# Patient Record
Sex: Female | Born: 1970 | Race: White | Hispanic: No | Marital: Married | Smoking: Never smoker
Health system: Southern US, Community
[De-identification: ages and names within clinical notes are randomized; demographics above are authoritative.]

---

## 2004-04-28 ENCOUNTER — Other Ambulatory Visit: Admission: RE | Admit: 2004-04-28 | Discharge: 2004-04-28 | Payer: Self-pay | Admitting: Obstetrics & Gynecology

## 2005-05-12 ENCOUNTER — Other Ambulatory Visit: Admission: RE | Admit: 2005-05-12 | Discharge: 2005-05-12 | Payer: Self-pay | Admitting: Obstetrics & Gynecology

## 2005-05-14 ENCOUNTER — Ambulatory Visit (HOSPITAL_COMMUNITY): Admission: RE | Admit: 2005-05-14 | Discharge: 2005-05-14 | Payer: Self-pay | Admitting: Obstetrics & Gynecology

## 2005-07-19 ENCOUNTER — Emergency Department: Payer: Self-pay | Admitting: Emergency Medicine

## 2006-11-15 ENCOUNTER — Ambulatory Visit: Payer: Self-pay

## 2006-11-30 ENCOUNTER — Ambulatory Visit: Payer: Self-pay

## 2006-12-28 ENCOUNTER — Ambulatory Visit: Payer: Self-pay

## 2007-01-12 ENCOUNTER — Inpatient Hospital Stay: Payer: Self-pay

## 2007-02-17 IMAGING — CR PELVIS - 1-2 VIEW
1 series · 1 of 1 positions shown · non-contrast
Comparison: none

REASON FOR EXAM: injury
COMMENTS:

PROCEDURE:     DXR - DXR PELVIS AP ONLY  - July 19, 2005  [DATE]
RESULT:       An AP view of the bony pelvis shows no fracture or other acute
bony abnormality. The hips are visualized bilaterally in the AP view and are
normal in appearance.

[view not recorded]
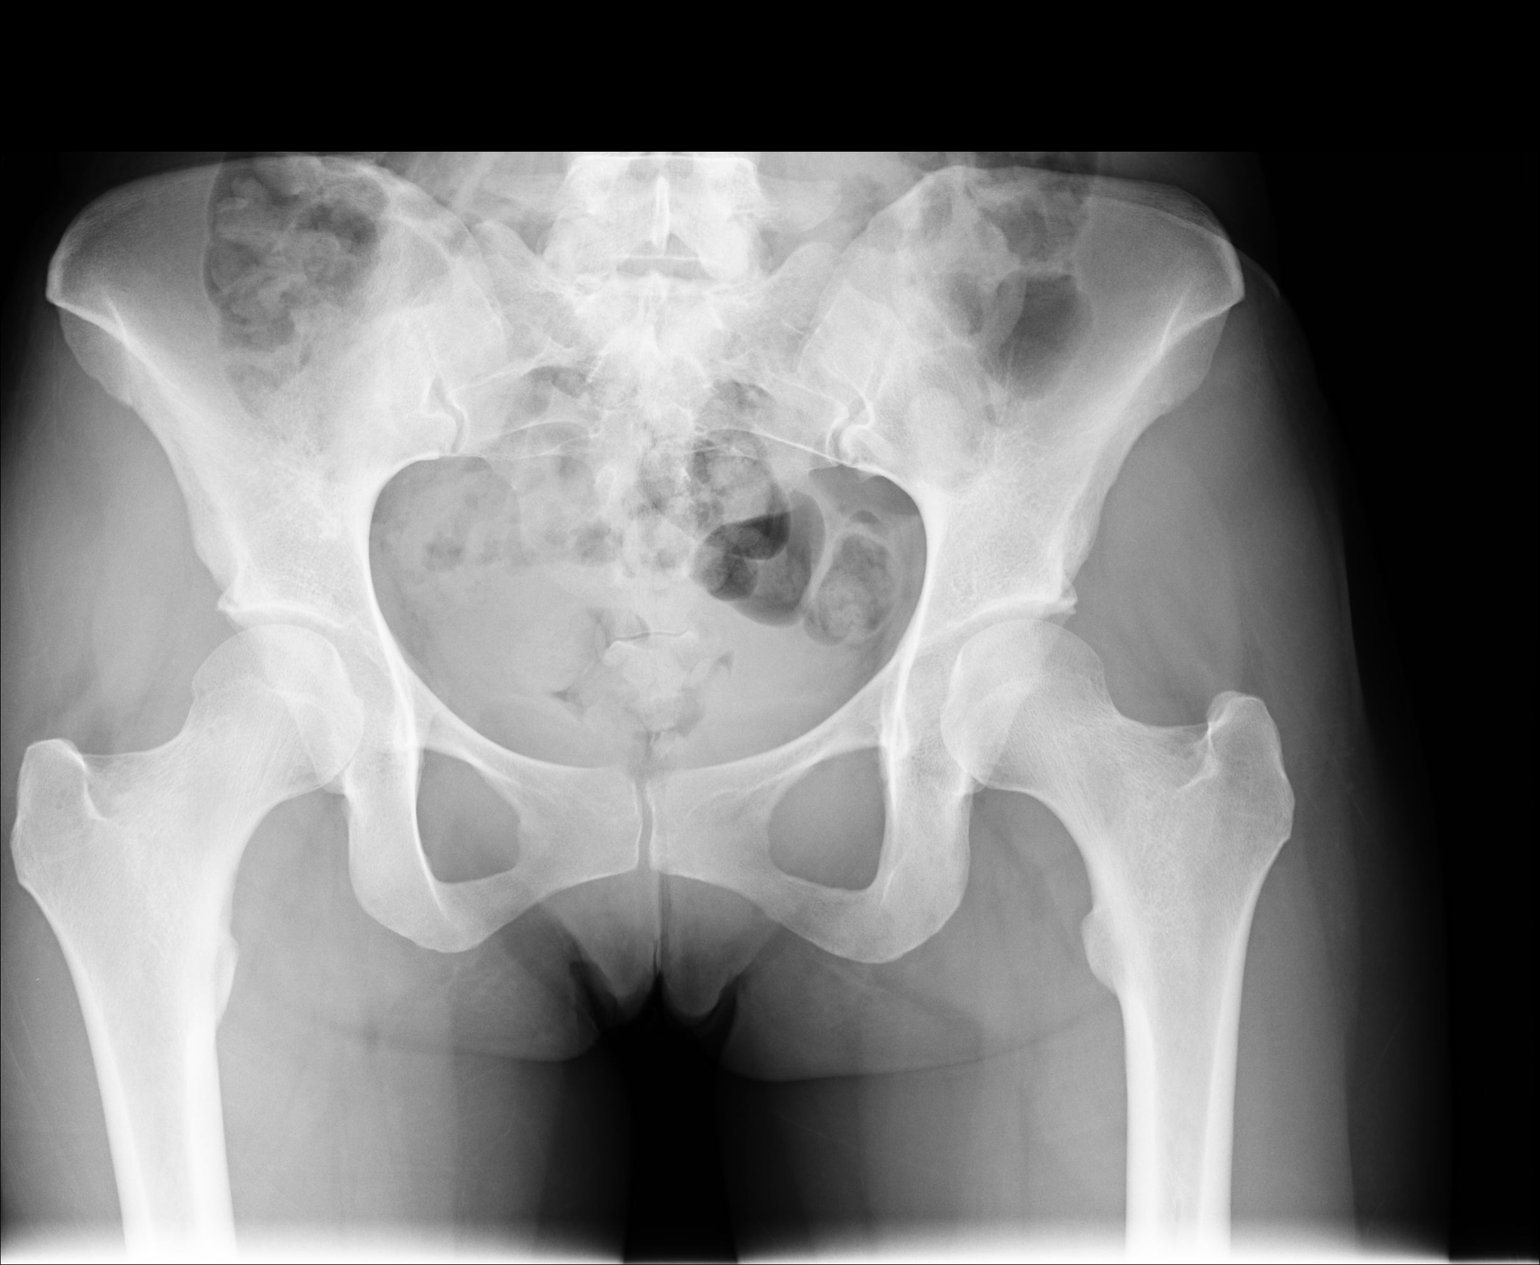

[1 of 1 positions shown; findings below may reference images not displayed]

IMPRESSION: No significant osseous abnormalities are noted.

## 2009-12-31 ENCOUNTER — Ambulatory Visit: Payer: Self-pay | Admitting: Obstetrics and Gynecology

## 2017-01-24 ENCOUNTER — Emergency Department: Payer: 59

## 2017-01-24 ENCOUNTER — Emergency Department
Admission: EM | Admit: 2017-01-24 | Discharge: 2017-01-24 | Disposition: A | Discharge status: Home / Self Care | Payer: 59 | Attending: Emergency Medicine | Admitting: Emergency Medicine

## 2017-01-24 DIAGNOSIS — Y929 Unspecified place or not applicable: Secondary | ICD-10-CM | POA: Diagnosis not present

## 2017-01-24 DIAGNOSIS — Y939 Activity, unspecified: Secondary | ICD-10-CM | POA: Insufficient documentation

## 2017-01-24 DIAGNOSIS — Y999 Unspecified external cause status: Secondary | ICD-10-CM | POA: Diagnosis not present

## 2017-01-24 DIAGNOSIS — S39012A Strain of muscle, fascia and tendon of lower back, initial encounter: Secondary | ICD-10-CM | POA: Insufficient documentation

## 2017-01-24 DIAGNOSIS — X58XXXA Exposure to other specified factors, initial encounter: Secondary | ICD-10-CM | POA: Diagnosis not present

## 2017-01-24 DIAGNOSIS — S3992XA Unspecified injury of lower back, initial encounter: Secondary | ICD-10-CM | POA: Diagnosis present

## 2017-01-24 LAB — POCT PREGNANCY, URINE: Preg Test, Ur: NEGATIVE

## 2017-01-24 MED ORDER — TRAMADOL HCL 50 MG PO TABS: 50.0000 mg | ORAL_TABLET | Freq: Four times a day (QID) | ORAL | 0 refills | Status: DC | PRN

## 2017-01-24 MED ORDER — CYCLOBENZAPRINE HCL 10 MG PO TABS: 10.0000 mg | ORAL_TABLET | Freq: Three times a day (TID) | ORAL | 0 refills | Status: DC | PRN

## 2017-01-24 MED ORDER — ONDANSETRON 8 MG PO TBDP
8.0000 mg | ORAL_TABLET | Freq: Once | ORAL | Status: AC
  Administered 2017-01-24: 8 mg via ORAL

## 2017-01-24 MED ORDER — ONDANSETRON 4 MG PO TBDP
ORAL_TABLET | ORAL | Status: AC
  Administered 2017-01-24: 8 mg via ORAL
  Filled 2017-01-24: qty 1

## 2017-01-24 MED ORDER — KETOROLAC TROMETHAMINE 60 MG/2ML IM SOLN
30.0000 mg | Freq: Once | INTRAMUSCULAR | Status: AC
  Administered 2017-01-24: 30 mg via INTRAMUSCULAR
  Filled 2017-01-24: qty 2

## 2017-01-24 MED ORDER — HYDROMORPHONE HCL 1 MG/ML IJ SOLN
1.0000 mg | Freq: Once | INTRAMUSCULAR | Status: AC
  Administered 2017-01-24: 1 mg via INTRAMUSCULAR
  Filled 2017-01-24: qty 1

## 2017-01-24 MED ORDER — ORPHENADRINE CITRATE 30 MG/ML IJ SOLN
60.0000 mg | Freq: Two times a day (BID) | INTRAMUSCULAR | Status: DC
  Administered 2017-01-24: 60 mg via INTRAMUSCULAR
  Filled 2017-01-24: qty 2

## 2017-01-24 MED ORDER — ONDANSETRON 4 MG PO TBDP: 4.0000 mg | ORAL_TABLET | Freq: Once | ORAL | Status: DC

## 2017-01-24 NOTE — ED Triage Notes (Signed)
Pt reports she is having severe back pain for the last 3 weeks - she went to urgent care last week and was given meloxicam and tylenol but neither are helper the pain - pt denies any urinary frequency/urgency/pain - reports back pain worse with all movement

## 2017-01-24 NOTE — ED Provider Notes (Signed)
[facility] Emergency Department Provider Note   ____________________________________________   First MD Initiated Contact with Patient 01/24/17 1926     (approximate)  I have reviewed the triage vital signs and the nursing notes.   HISTORY  Chief Complaint Back Pain    HPI Natalie Floyd is a 46 y.o. female patient complaining of 3 weeks of severe back pain. Patient cannot recall any provocative measures for her complaint. Patient states she went to urgent care clinic last week and was given meloxicam and Tylenol. She stated neither one is helping. She stated the examiner told her back might be out of line. Patient denies any radicular component to her pain. Patient denies any bladder or bowel dysfunction. Patient rates the pain as a 5/10.Patient described a pain as "burning/achy". No other palliative measures for her complaint.   History reviewed. No pertinent past medical history.  There are no active problems to display for this patient.   History reviewed. No pertinent surgical history.  Prior to Admission medications   Medication Sig Start Date End Date Taking? Authorizing Provider  cyclobenzaprine (FLEXERIL) 10 MG tablet Take 1 tablet (10 mg total) by mouth 3 (three) times daily as needed. 01/24/17   Joni Reining, PA-C  traMADol (ULTRAM) 50 MG tablet Take 1 tablet (50 mg total) by mouth every 6 (six) hours as needed. 01/24/17 01/24/18  Joni Reining, PA-C    Allergies Patient has no known allergies.  No family history on file.  Social History Social History  Substance Use Topics  . Smoking status: Never Smoker  . Smokeless tobacco: Never Used  . Alcohol use No    Review of Systems Constitutional: No fever/chills Eyes: No visual changes. ENT: No sore throat. Cardiovascular: Denies chest pain. Respiratory: Denies shortness of breath. Gastrointestinal: No abdominal pain.  No nausea, no vomiting.  No diarrhea.  No  constipation. Genitourinary: Negative for dysuria. Musculoskeletal:Positive for back pain. Skin: Negative for rash. Neurological: Negative for headaches, focal weakness or numbness.    ____________________________________________   PHYSICAL EXAM:  VITAL SIGNS: ED Triage Vitals  Enc Vitals Group     BP 01/24/17 1831 135/66     Pulse Rate 01/24/17 1831 63     Resp 01/24/17 1831 15     Temp 01/24/17 1831 98.4 F (36.9 C)     Temp Source 01/24/17 1831 Oral     SpO2 01/24/17 1831 100 %     Weight 01/24/17 1829 140 lb (63.5 kg)     Height 01/24/17 1829 5\' 5"  (1.651 m)     Head Circumference --      Peak Flow --      Pain Score 01/24/17 1830 5     Pain Loc --      Pain Edu? --      Excl. in GC? --     Constitutional: Alert and oriented. Well appearing and in no acute distress. Eyes: Conjunctivae are normal. PERRL. EOMI. Head: Atraumatic. Nose: No congestion/rhinnorhea. Mouth/Throat: Mucous membranes are moist.  Oropharynx non-erythematous. Neck: No stridor.  No cervical spine tenderness to palpation. Hematological/Lymphatic/Immunilogical: No cervical lymphadenopathy. Cardiovascular: Normal rate, regular rhythm. Grossly normal heart sounds.  Good peripheral circulation. Respiratory: Normal respiratory effort.  No retractions. Lungs CTAB. Gastrointestinal: Soft and nontender. No distention. No abdominal bruits. No CVA tenderness. Musculoskeletal: Patient states this is stable heavy reliance on upper extremity movement is that manner. No obvious spinal deformity. Patient is nontender palpation spinal processes. Patient is some moderate  guarding palpation of the left paraspinal muscle group. Patient has increased muscle spasm with right lateral movements. Patient Straight-leg test. Neurologic:  Normal speech and language. No gross focal neurologic deficits are appreciated. No gait instability. Skin:  Skin is warm, dry and intact. No rash noted. Psychiatric: Mood and affect are  normal. Speech and behavior are normal.  ____________________________________________   LABS (all labs ordered are listed, but only abnormal results are displayed)  Labs Reviewed  POC URINE PREG, ED  POCT PREGNANCY, URINE   ____________________________________________  EKG   ____________________________________________  RADIOLOGY  No acute findings on lumbar ____________________________________________   PROCEDURES  Procedure(s) performed: None  Procedures  Critical Care performed: No  ____________________________________________   INITIAL IMPRESSION / ASSESSMENT AND PLAN / ED COURSE  Pertinent labs & imaging results that were available during my care of the patient were reviewed by me and considered in my medical decision making (see chart for details).  Lumbar strain. Patient given discharge Instructions. Patient given prescription for tramadol and Flexeril. Patient advised follow-up family doctor condition persists.      ____________________________________________   FINAL CLINICAL IMPRESSION(S) / ED DIAGNOSES  Final diagnoses:  Strain of lumbar region, initial encounter      NEW MEDICATIONS STARTED DURING THIS VISIT:  New Prescriptions   CYCLOBENZAPRINE (FLEXERIL) 10 MG TABLET    Take 1 tablet (10 mg total) by mouth 3 (three) times daily as needed.   TRAMADOL (ULTRAM) 50 MG TABLET    Take 1 tablet (50 mg total) by mouth every 6 (six) hours as needed.     Note:  This document was prepared using Dragon voice recognition software and may include unintentional dictation errors.    Joni ReiningRonald K Smith, PA-C 01/24/17 2026    Jennye MoccasinBrian S Quigley, MD 01/24/17 2204

## 2017-02-07 ENCOUNTER — Encounter: Payer: Self-pay | Admitting: Emergency Medicine

## 2017-02-07 ENCOUNTER — Emergency Department
Admission: EM | Admit: 2017-02-07 | Discharge: 2017-02-07 | Disposition: A | Discharge status: Home / Self Care | Payer: 59 | Attending: Emergency Medicine | Admitting: Emergency Medicine

## 2017-02-07 DIAGNOSIS — M5442 Lumbago with sciatica, left side: Secondary | ICD-10-CM | POA: Insufficient documentation

## 2017-02-07 DIAGNOSIS — M5432 Sciatica, left side: Secondary | ICD-10-CM

## 2017-02-07 DIAGNOSIS — M545 Low back pain: Secondary | ICD-10-CM | POA: Diagnosis present

## 2017-02-07 MED ORDER — CYCLOBENZAPRINE HCL 10 MG PO TABS: 10.0000 mg | ORAL_TABLET | Freq: Three times a day (TID) | ORAL | 0 refills | Status: DC | PRN

## 2017-02-07 MED ORDER — HYDROCODONE-ACETAMINOPHEN 5-325 MG PO TABS: 1.0000 | ORAL_TABLET | ORAL | 0 refills | Status: AC | PRN

## 2017-02-07 MED ORDER — PREDNISONE 10 MG (21) PO TBPK: ORAL_TABLET | ORAL | 0 refills | Status: DC

## 2017-02-07 NOTE — ED Provider Notes (Signed)
[facility] Emergency Department Provider Note ____________________________________________  Time seen: Approximately 8:31 AM  I have reviewed the triage vital signs and the nursing notes.   HISTORY  Chief Complaint Leg Pain    HPI Natalie Floyd is a 46 y.o. female who presents to the emergency department for evaluation of low back pain with radiation into her left leg. She is unaware of any injury. She states that she sits at a desk all day. She has taken tylenol and ibuprofen without relief. Standing gives some relief. Lying and sitting down increase the pain.   History reviewed. No pertinent past medical history.  There are no active problems to display for this patient.   History reviewed. No pertinent surgical history.  Prior to Admission medications   Medication Sig Start Date End Date Taking? Authorizing Provider  cyclobenzaprine (FLEXERIL) 10 MG tablet Take 1 tablet (10 mg total) by mouth 3 (three) times daily as needed for muscle spasms. 02/07/17   Chinita Pesterari B Genola Yuille, FNP  HYDROcodone-acetaminophen (NORCO/VICODIN) 5-325 MG tablet Take 1 tablet by mouth every 4 (four) hours as needed for moderate pain. 02/07/17 02/10/17  Chinita Pesterari B Denis Koppel, FNP  predniSONE (STERAPRED UNI-PAK 21 TAB) 10 MG (21) TBPK tablet Take 6 tablets on day 1 Take 5 tablets on day 2 Take 4 tablets on day 3 Take 3 tablets on day 4 Take 2 tablets on day 5 Take 1 tablet on day 6 02/07/17   Chinita Pesterari B Alejos Reinhardt, FNP    Allergies Patient has no known allergies.  History reviewed. No pertinent family history.  Social History Social History  Substance Use Topics  . Smoking status: Never Smoker  . Smokeless tobacco: Never Used  . Alcohol use No    Review of Systems Constitutional: No recent illness. Cardiovascular: Denies chest pain or palpitations. Respiratory: Denies shortness of breath. Musculoskeletal: Positive for back and left leg pain. Skin: Negative for rash, wound,  lesion. Neurological: Negative for focal weakness or numbness.  ____________________________________________   PHYSICAL EXAM:  VITAL SIGNS: ED Triage Vitals  Enc Vitals Group     BP 02/07/17 0531 137/85     Pulse Rate 02/07/17 0531 78     Resp 02/07/17 0531 16     Temp 02/07/17 0531 98 F (36.7 C)     Temp Source 02/07/17 0531 Oral     SpO2 02/07/17 0531 99 %     Weight 02/07/17 0530 140 lb (63.5 kg)     Height 02/07/17 0530 5\' 5"  (1.651 m)     Head Circumference --      Peak Flow --      Pain Score 02/07/17 0547 5     Pain Loc --      Pain Edu? --      Excl. in GC? --     Constitutional: Alert and oriented. Well appearing and in no acute distress. Eyes: Conjunctivae are normal. EOMI. Head: Atraumatic. Neck: No stridor.  Respiratory: Normal respiratory effort.   Musculoskeletal: Pain in left lower back with radiation into the posterior left leg to the ankle. Straight leg raise is positive on the left at about 45 degrees.  Neurologic:  Normal speech and language. No gross focal neurologic deficits are appreciated. Speech is normal. No gait instability. Skin:  Skin is warm, dry and intact. Atraumatic. Psychiatric: Mood and affect are normal. Speech and behavior are normal.  ____________________________________________   LABS (all labs ordered are listed, but only abnormal results are displayed)  Labs Reviewed -  No data to display ____________________________________________  RADIOLOGY  Not indicated. ____________________________________________   PROCEDURES  Procedure(s) performed: None   ____________________________________________   INITIAL IMPRESSION / ASSESSMENT AND PLAN / ED COURSE     Pertinent labs & imaging results that were available during my care of the patient were reviewed by me and considered in my medical decision making (see chart for details).  28 -year-old female presenting to the emergency department for treatment of left lower back  pain with radiation into the left lower extremity. She will be started on prescription of prednisone and Flexeril to be taken in addition to her ibuprofen. She was encouraged to call and follow-up with the orthopedist. She was advised to return to the emergency department for symptoms that change or worsen if she is unable schedule an appointment. ____________________________________________   FINAL CLINICAL IMPRESSION(S) / ED DIAGNOSES  Final diagnoses:  Sciatica of left side       Chinita Pester, FNP 02/07/17 1546    Minna Antis, MD 02/08/17 2240

## 2017-02-07 NOTE — Discharge Instructions (Signed)
Follow up with the orthopedic doctor. Return to the ER for symptoms that change or worsen if you are unable to schedule an appointment.

## 2017-02-07 NOTE — ED Notes (Signed)

## 2017-02-07 NOTE — ED Triage Notes (Addendum)
Pt seen here 2 weeks ago for low back pain; diagnosed with strain; here this am with pain radiating down the back of her left leg; pt says pain is worse when she lays down

## 2019-06-21 ENCOUNTER — Other Ambulatory Visit: Payer: Self-pay | Admitting: Obstetrics and Gynecology

## 2019-06-21 DIAGNOSIS — N6320 Unspecified lump in the left breast, unspecified quadrant: Secondary | ICD-10-CM

## 2019-12-25 ENCOUNTER — Encounter: Payer: Self-pay | Admitting: Emergency Medicine

## 2019-12-25 ENCOUNTER — Ambulatory Visit
Admission: EM | Admit: 2019-12-25 | Discharge: 2019-12-25 | Disposition: A | Discharge status: Home / Self Care | Payer: No Typology Code available for payment source | Attending: Emergency Medicine | Admitting: Emergency Medicine

## 2019-12-25 ENCOUNTER — Other Ambulatory Visit: Payer: Self-pay

## 2019-12-25 DIAGNOSIS — Z20828 Contact with and (suspected) exposure to other viral communicable diseases: Secondary | ICD-10-CM | POA: Diagnosis not present

## 2019-12-25 DIAGNOSIS — R0982 Postnasal drip: Secondary | ICD-10-CM

## 2019-12-25 DIAGNOSIS — R0981 Nasal congestion: Secondary | ICD-10-CM

## 2019-12-25 NOTE — Discharge Instructions (Signed)
Your COVID test is pending - it is important to quarantine / isolate at home until your results are back. °If you test positive and would like further evaluation for persistent or worsening symptoms, you may schedule an E-visit or virtual (video) visit throughout the [facility] MyChart app or website. ° °PLEASE NOTE: If you develop severe chest pain or shortness of breath please go to the ER or call 9-1-1 for further evaluation --> DO NOT schedule electronic or virtual visits for this. °Please call our office for further guidance / recommendations as needed. °

## 2019-12-25 NOTE — ED Provider Notes (Signed)
[facility]    CSN: [id number] Arrival date & time: 12/25/19  0815      History   Chief Complaint Chief Complaint  Patient presents with  . Fever    HPI Natalie Floyd is a 48 y.o. female presenting for 3-day course of nasal congestion, postnasal drip.  Patient reports fever on Christmas day: T-max 100 F.  Has not take anything for symptoms.  Patient does endorse history of annual sinusitis, does not always take antibiotics for this.  Patient not currently take anything for symptoms.  No known sick contacts.  Denies cough, shortness of breath.    History reviewed. No pertinent past medical history.  There are no problems to display for this patient.   History reviewed. No pertinent surgical history.  OB History   No obstetric history on file.      Home Medications    Prior to Admission medications   Not on File    Family History Family History  Problem Relation Age of Onset  . COPD Mother     Social History Social History   Tobacco Use  . Smoking status: Never Smoker  . Smokeless tobacco: Never Used  Substance Use Topics  . Alcohol use: No  . Drug use: No     Allergies   Patient has no known allergies.   Review of Systems Review of Systems  Constitutional: Positive for fever. Negative for activity change, appetite change and fatigue.  HENT: Positive for congestion and postnasal drip. Negative for dental problem, ear pain, facial swelling, hearing loss, sinus pressure, sinus pain, sore throat, trouble swallowing and voice change.   Eyes: Negative for photophobia, pain and visual disturbance.  Respiratory: Negative for cough, shortness of breath and wheezing.   Cardiovascular: Negative for chest pain and palpitations.  Gastrointestinal: Negative for diarrhea and vomiting.  Musculoskeletal: Negative for arthralgias and myalgias.  Neurological: Negative for dizziness, facial asymmetry, weakness, light-headedness and headaches.      Physical Exam Triage Vital Signs ED Triage Vitals  Enc Vitals Group     BP      Pulse      Resp      Temp      Temp src      SpO2      Weight      Height      Head Circumference      Peak Flow      Pain Score      Pain Loc      Pain Edu?      Excl. in GC?    No data found.  Updated Vital Signs BP (!) 143/96 (BP Location: Left Arm)   Pulse 89   Temp (!) 97.5 F (36.4 C) (Temporal)   Resp 18   SpO2 96%   Visual Acuity Right Eye Distance:   Left Eye Distance:   Bilateral Distance:    Right Eye Near:   Left Eye Near:    Bilateral Near:     Physical Exam Constitutional:      General: She is not in acute distress.    Appearance: She is normal weight. She is not ill-appearing.  HENT:     Head: Normocephalic and atraumatic.     Nose:     Comments: Positive for mucosal pallor.  Negative for sinus tenderness    Mouth/Throat:     Mouth: Mucous membranes are moist.     Pharynx: Oropharynx is clear. No oropharyngeal exudate or posterior oropharyngeal erythema.  Eyes:     General: No scleral icterus.    Conjunctiva/sclera: Conjunctivae normal.     Pupils: Pupils are equal, round, and reactive to light.  Cardiovascular:     Rate and Rhythm: Normal rate.  Pulmonary:     Effort: Pulmonary effort is normal. No respiratory distress.     Breath sounds: No wheezing.  Musculoskeletal:     Cervical back: Neck supple. No tenderness.  Lymphadenopathy:     Cervical: No cervical adenopathy.  Skin:    Capillary Refill: Capillary refill takes less than 2 seconds.     Coloration: Skin is not jaundiced or pale.  Neurological:     Mental Status: She is alert and oriented to person, place, and time.      UC Treatments / Results  Labs (all labs ordered are listed, but only abnormal results are displayed) Labs Reviewed  NOVEL CORONAVIRUS, NAA    EKG   Radiology No results found.  Procedures Procedures (including critical care time)  Medications Ordered in  UC Medications - No data to display  Initial Impression / Assessment and Plan / UC Course  I have reviewed the triage vital signs and the nursing notes.  Pertinent labs & imaging results that were available during my care of the patient were reviewed by me and considered in my medical decision making (see chart for details).     Patient afebrile, nontoxic, with SpO2 96%.  Covid PCR pending.  Patient to quarantine until results are back.  We will start daytime antihistamine, intranasal steroid spray for sinus symptom management.  Return precautions discussed, patient verbalized understanding and is agreeable to plan. Final Clinical Impressions(s) / UC Diagnoses   Final diagnoses:  Nasal congestion     Discharge Instructions     Your COVID test is pending - it is important to quarantine / isolate at home until your results are back. If you test positive and would like further evaluation for persistent or worsening symptoms, you may schedule an E-visit or virtual (video) visit throughout the [facility] app or website.  PLEASE NOTE: If you develop severe chest pain or shortness of breath please go to the ER or call 9-1-1 for further evaluation --> DO NOT schedule electronic or virtual visits for this. Please call our office for further guidance / recommendations as needed.    ED Prescriptions    None     PDMP not reviewed this encounter.   Neldon Mc [place], [place] 12/25/19 [phone]

## 2019-12-25 NOTE — ED Triage Notes (Signed)
Pt presents to [facility] for assessment of fever approx 100 all day 12/25.  States she has had nasal congestion, clearing throat from nasal congestion.  Hx of sinus infections.

## 2019-12-27 ENCOUNTER — Telehealth: Payer: Self-pay | Admitting: Emergency Medicine

## 2019-12-27 LAB — NOVEL CORONAVIRUS, NAA: SARS-CoV-2, NAA: DETECTED — AB

## 2019-12-27 NOTE — Telephone Encounter (Signed)

## 2020-03-30 ENCOUNTER — Ambulatory Visit: Payer: No Typology Code available for payment source | Attending: Internal Medicine

## 2020-03-30 DIAGNOSIS — Z23 Encounter for immunization: Secondary | ICD-10-CM

## 2020-03-30 NOTE — Progress Notes (Signed)
   Covid-19 Vaccination Clinic  Name:  Natalie Floyd    MRN: [id number] DOB: 1971-02-02  03/30/2020  Ms. [name] was observed post Covid-19 immunization for 15 minutes without incident. She was provided with Vaccine Information Sheet and instruction to access the V-Safe system.   Ms. [name] was instructed to call 911 with any severe reactions post vaccine: Marland Kitchen Difficulty breathing  . Swelling of face and throat  . A fast heartbeat  . A bad rash all over body  . Dizziness and weakness   Immunizations Administered    Name Date Dose VIS Date Route   Pfizer COVID-19 Vaccine 03/30/2020  8:19 AM 0.3 mL 12/08/2019 Intramuscular   Manufacturer: [employer], [employer]   Lot: [id number]   NDC: [address]-6

## 2020-04-23 ENCOUNTER — Ambulatory Visit: Payer: No Typology Code available for payment source | Attending: Internal Medicine

## 2020-04-23 DIAGNOSIS — Z23 Encounter for immunization: Secondary | ICD-10-CM

## 2020-04-23 NOTE — Progress Notes (Signed)
   Covid-19 Vaccination Clinic  Name:  AVRY MONTELEONE    MRN: [id number] DOB: [birth year]  04/23/2020  Ms. Brickner was observed post Covid-19 immunization for 15 minutes without incident. She was provided with Vaccine Information Sheet and instruction to access the V-Safe system.   Ms. Schweigert was instructed to call 911 with any severe reactions post vaccine: Marland Kitchen Difficulty breathing  . Swelling of face and throat  . A fast heartbeat  . A bad rash all over body  . Dizziness and weakness   Immunizations Administered    Name Date Dose VIS Date Route   Pfizer COVID-19 Vaccine 04/23/2020  8:51 AM 0.3 mL 02/21/2019 Intramuscular   Manufacturer: [employer], [employer]   Lot: [id number]   NDC: [address]-7

## 2021-12-16 ENCOUNTER — Other Ambulatory Visit: Payer: Self-pay | Admitting: Obstetrics and Gynecology

## 2021-12-16 DIAGNOSIS — N6320 Unspecified lump in the left breast, unspecified quadrant: Secondary | ICD-10-CM

## 2022-01-28 ENCOUNTER — Ambulatory Visit
Admission: RE | Admit: 2022-01-28 | Discharge: 2022-01-28 | Disposition: A | Discharge status: Home / Self Care | Payer: PRIVATE HEALTH INSURANCE | Source: Ambulatory Visit | Attending: Obstetrics and Gynecology | Admitting: Obstetrics and Gynecology

## 2022-01-28 DIAGNOSIS — N6320 Unspecified lump in the left breast, unspecified quadrant: Secondary | ICD-10-CM

## 2022-02-05 ENCOUNTER — Other Ambulatory Visit: Payer: No Typology Code available for payment source

## 2023-08-29 IMAGING — US US BREAST*L* LIMITED INC AXILLA
1 series · 5 of 5 positions shown · non-contrast
Comparison: Previous exam(s).

CLINICAL DATA: Screening recall for possible left breast mass.

EXAM:
ULTRASOUND OF THE LEFT BREAST

[Series 1: us breast*left* limited inc axilla · 0.08mm/px · 5 of 5 slices shown]
[im 1/5]
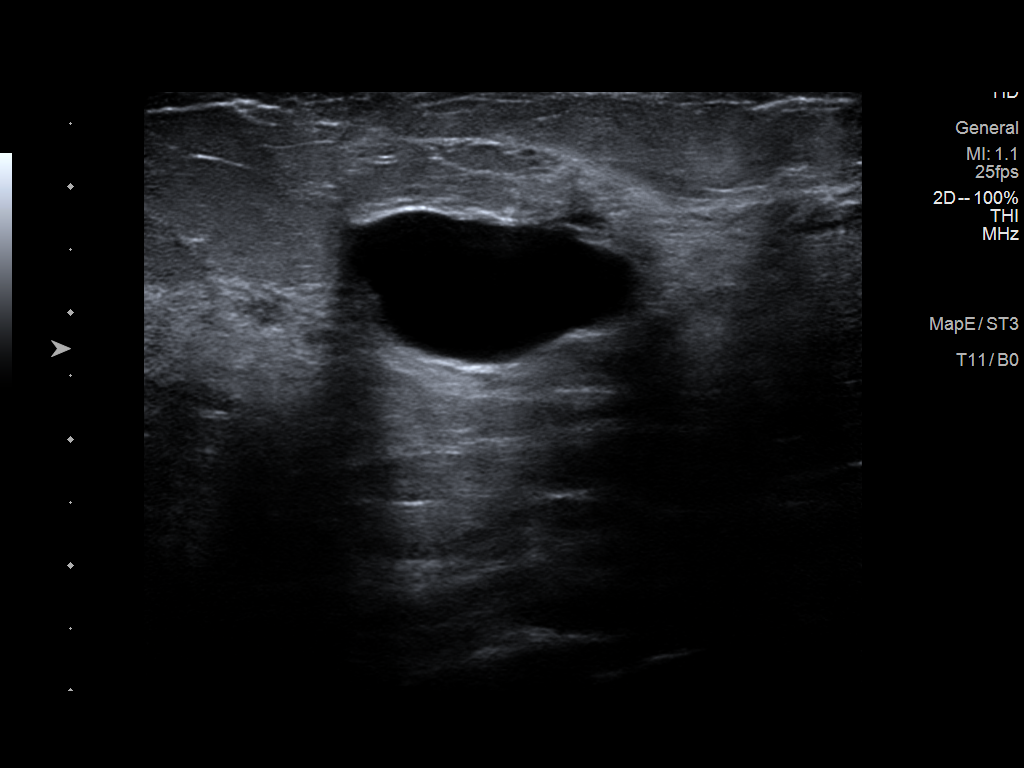
[im 2/5]
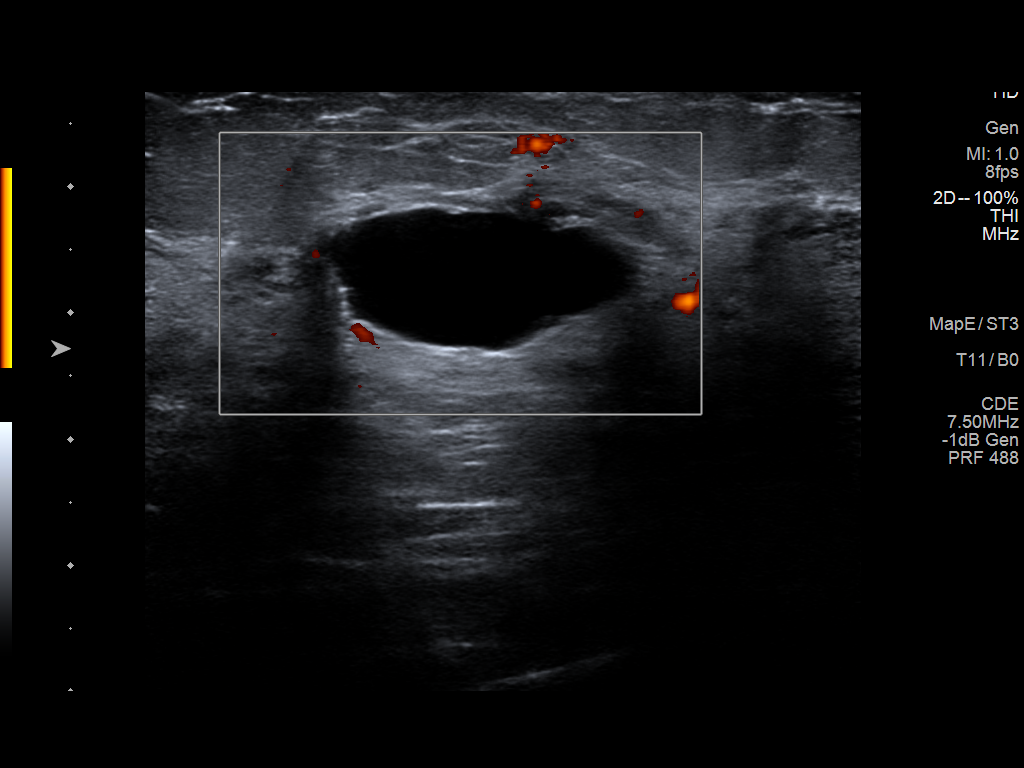
[im 3/5]
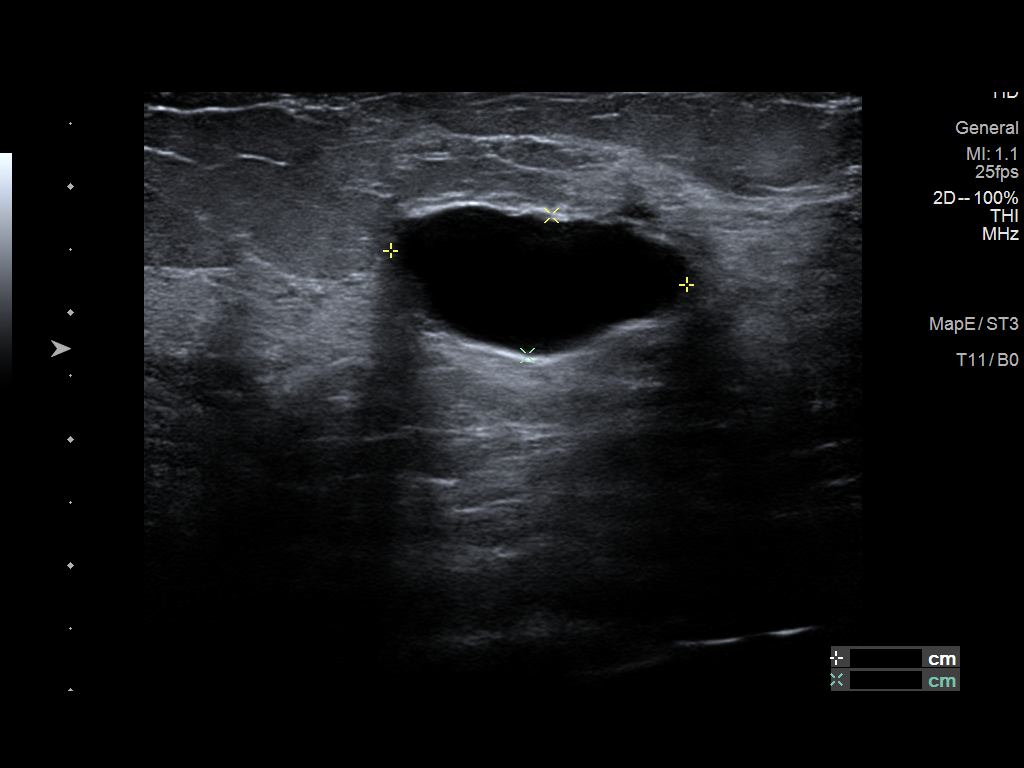
[im 4/5]
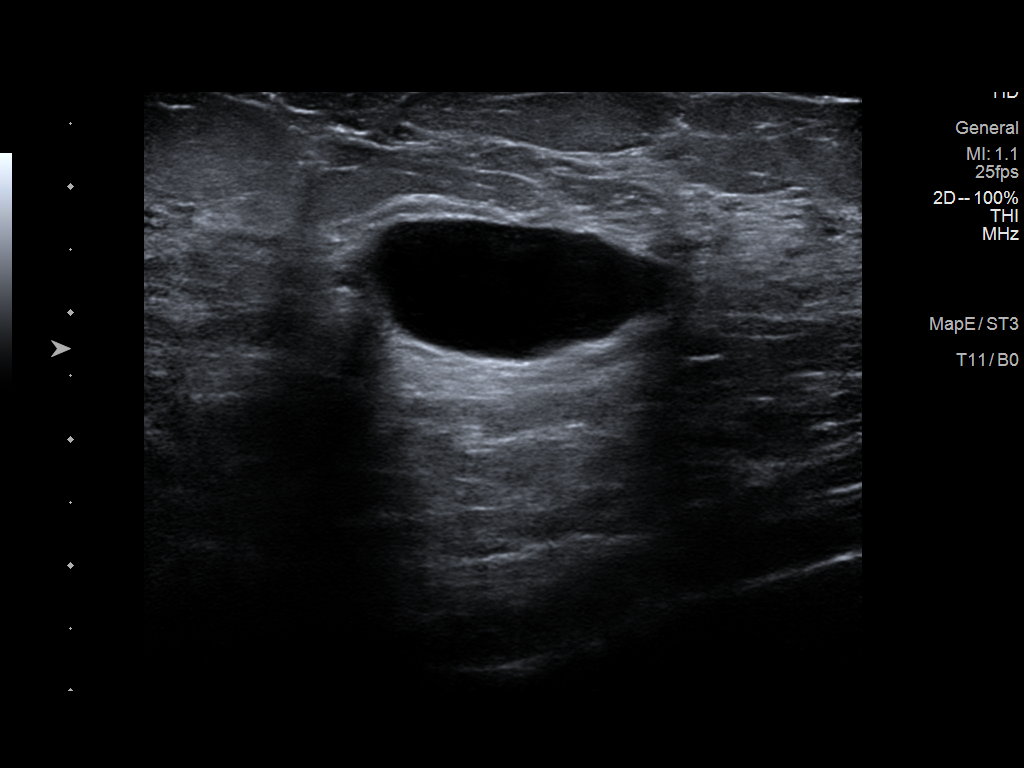
[im 5/5]
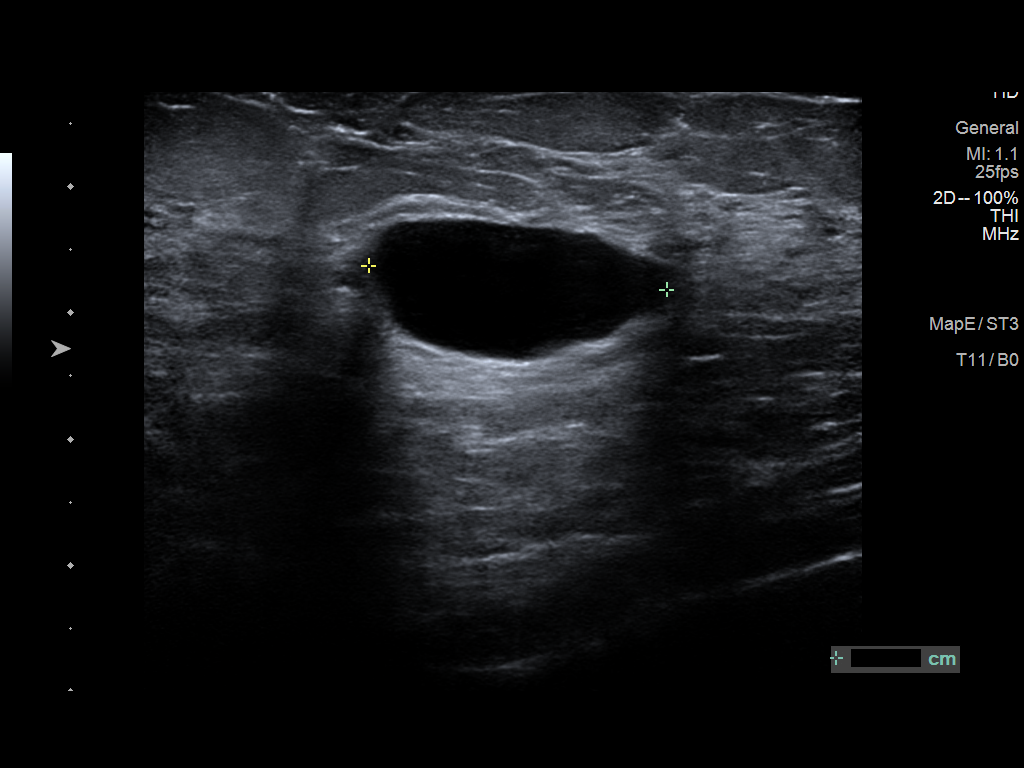

[5 of 5 positions shown; findings below may reference images not displayed]

FINDINGS: Targeted ultrasound is performed, showing a simple cyst in the left
breast at 1 o'clock, 3 cm the nipple, middle depth, measuring 2.4 x
1.1 x 2.4 cm, consistent in size, shape and location to the
mammographic mass. There are no solid masses or suspicious lesions.
IMPRESSION: 1. No evidence of breast malignancy.
2. Benign left breast cyst.

RECOMMENDATION:
Screening mammogram in one year.(Code:[id number])

I have discussed the findings and recommendations with the patient.
If applicable, a reminder letter will be sent to the patient
regarding the next appointment.

BI-RADS CATEGORY  2: Benign.
